# Patient Record
Sex: Male | Born: 1990 | Race: Black or African American | Hispanic: No | Marital: Single | Smoking: Current every day smoker
Health system: Southern US, Community
[De-identification: ages and names within clinical notes are randomized; demographics above are authoritative.]

## PROBLEM LIST (undated history)

## (undated) DIAGNOSIS — W3400XA Accidental discharge from unspecified firearms or gun, initial encounter: Secondary | ICD-10-CM

---
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
## 2008-04-11 ENCOUNTER — Observation Stay (HOSPITAL_COMMUNITY): Admission: AC | Admit: 2008-04-11 | Discharge: 2008-04-12 | Payer: Self-pay
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
## 2011-09-07 LAB — SAMPLE TO BLOOD BANK

## 2011-09-07 LAB — POCT I-STAT, CHEM 8
Creatinine, Ser: 1.1
HCT: 47
Hemoglobin: 16
Potassium: 2.8 — ABNORMAL LOW
Sodium: 141
TCO2: 24

## 2011-09-07 LAB — DIFFERENTIAL
Basophils Absolute: 0
Basophils Relative: 0
Lymphocytes Relative: 9 — ABNORMAL LOW
Monocytes Absolute: 0.7
Neutro Abs: 14.6 — ABNORMAL HIGH
Neutrophils Relative %: 87 — ABNORMAL HIGH

## 2011-09-07 LAB — CBC
HCT: 42.6
MCHC: 34.6
Platelets: 175
Platelets: 232
RDW: 14.3
RDW: 14.4
WBC: 11.8 — ABNORMAL HIGH

## 2011-09-07 LAB — PROTIME-INR
INR: 0.9
Prothrombin Time: 12.3

## 2011-09-17 LAB — DIFFERENTIAL
Basophils Absolute: 0
Eosinophils Relative: 2
Lymphocytes Relative: 43
Lymphs Abs: 5 — ABNORMAL HIGH
Monocytes Absolute: 0.6
Neutro Abs: 5.2

## 2011-09-17 LAB — TYPE AND SCREEN: ABO/RH(D): O POS

## 2011-09-17 LAB — CBC
HCT: 43
Hemoglobin: 14.7
RDW: 14.4
WBC: 14 — ABNORMAL HIGH

## 2011-09-17 LAB — I-STAT 8, (EC8 V) (CONVERTED LAB)
BUN: 10
Bicarbonate: 21.9
Glucose, Bld: 130 — ABNORMAL HIGH
TCO2: 23
pCO2, Ven: 39.1 — ABNORMAL LOW

## 2011-09-17 LAB — POCT I-STAT CREATININE: Operator id: 288831

## 2011-09-17 LAB — ABO/RH: ABO/RH(D): O POS

## 2014-12-04 ENCOUNTER — Emergency Department (HOSPITAL_COMMUNITY)
Admission: EM | Admit: 2014-12-04 | Discharge: 2014-12-04 | Disposition: A | Discharge status: Home / Self Care | Payer: Self-pay | Attending: Emergency Medicine | Admitting: Emergency Medicine

## 2014-12-04 ENCOUNTER — Encounter (HOSPITAL_COMMUNITY): Payer: Self-pay

## 2014-12-04 ENCOUNTER — Emergency Department (HOSPITAL_COMMUNITY): Payer: Self-pay

## 2014-12-04 DIAGNOSIS — W228XXA Striking against or struck by other objects, initial encounter: Secondary | ICD-10-CM | POA: Insufficient documentation

## 2014-12-04 DIAGNOSIS — Z23 Encounter for immunization: Secondary | ICD-10-CM | POA: Insufficient documentation

## 2014-12-04 DIAGNOSIS — Y9389 Activity, other specified: Secondary | ICD-10-CM | POA: Insufficient documentation

## 2014-12-04 DIAGNOSIS — Z720 Tobacco use: Secondary | ICD-10-CM | POA: Insufficient documentation

## 2014-12-04 DIAGNOSIS — Z87828 Personal history of other (healed) physical injury and trauma: Secondary | ICD-10-CM | POA: Insufficient documentation

## 2014-12-04 DIAGNOSIS — Y998 Other external cause status: Secondary | ICD-10-CM | POA: Insufficient documentation

## 2014-12-04 DIAGNOSIS — S61210A Laceration without foreign body of right index finger without damage to nail, initial encounter: Secondary | ICD-10-CM | POA: Insufficient documentation

## 2014-12-04 DIAGNOSIS — IMO0002 Reserved for concepts with insufficient information to code with codable children: Secondary | ICD-10-CM

## 2014-12-04 DIAGNOSIS — S41111A Laceration without foreign body of right upper arm, initial encounter: Secondary | ICD-10-CM | POA: Insufficient documentation

## 2014-12-04 DIAGNOSIS — Y9289 Other specified places as the place of occurrence of the external cause: Secondary | ICD-10-CM | POA: Insufficient documentation

## 2014-12-04 DIAGNOSIS — S51021A Laceration with foreign body of right elbow, initial encounter: Secondary | ICD-10-CM | POA: Insufficient documentation

## 2014-12-04 HISTORY — DX: Accidental discharge from unspecified firearms or gun, initial encounter: W34.00XA

## 2014-12-04 MED ORDER — TETANUS-DIPHTH-ACELL PERTUSSIS 5-2.5-18.5 LF-MCG/0.5 IM SUSP
0.5000 mL | Freq: Once | INTRAMUSCULAR | Status: AC
  Administered 2014-12-04: 0.5 mL via INTRAMUSCULAR
  Filled 2014-12-04: qty 0.5

## 2014-12-04 MED ORDER — OXYCODONE-ACETAMINOPHEN 5-325 MG PO TABS: 1.0000 | ORAL_TABLET | Freq: Once | ORAL | Status: DC

## 2014-12-04 MED ORDER — TRAMADOL HCL 50 MG PO TABS
50.0000 mg | ORAL_TABLET | Freq: Once | ORAL | Status: DC
Start: 2014-12-04 — End: 2014-12-04
  Filled 2014-12-04: qty 1

## 2014-12-04 MED ORDER — OXYCODONE-ACETAMINOPHEN 5-325 MG PO TABS: 1.0000 | ORAL_TABLET | Freq: Four times a day (QID) | ORAL | Status: DC | PRN

## 2014-12-04 MED ORDER — LIDOCAINE HCL (PF) 1 % IJ SOLN
5.0000 mL | Freq: Once | INTRAMUSCULAR | Status: AC
  Administered 2014-12-04: 5 mL
  Filled 2014-12-04: qty 5

## 2014-12-04 NOTE — ED Notes (Signed)
Pt called asking if a [employer] phone was turned in. 1 [employer] phone in lost and found, pt was unsure if this was his phone.
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
## 2014-12-04 NOTE — Discharge Instructions (Signed)
Your sutures should be removed in 10 days

## 2014-12-13 ENCOUNTER — Emergency Department (HOSPITAL_COMMUNITY)
Admission: EM | Admit: 2014-12-13 | Discharge: 2014-12-13 | Disposition: A | Discharge status: Home / Self Care | Payer: Self-pay | Attending: Emergency Medicine | Admitting: Emergency Medicine

## 2014-12-13 ENCOUNTER — Encounter (HOSPITAL_COMMUNITY): Payer: Self-pay | Admitting: Emergency Medicine

## 2014-12-13 DIAGNOSIS — Y848 Other medical procedures as the cause of abnormal reaction of the patient, or of later complication, without mention of misadventure at the time of the procedure: Secondary | ICD-10-CM | POA: Insufficient documentation

## 2014-12-13 DIAGNOSIS — T798XXA Other early complications of trauma, initial encounter: Secondary | ICD-10-CM

## 2014-12-13 DIAGNOSIS — T814XXA Infection following a procedure, initial encounter: Secondary | ICD-10-CM | POA: Insufficient documentation

## 2014-12-13 DIAGNOSIS — Z4802 Encounter for removal of sutures: Secondary | ICD-10-CM | POA: Insufficient documentation

## 2014-12-13 DIAGNOSIS — Z720 Tobacco use: Secondary | ICD-10-CM | POA: Insufficient documentation

## 2014-12-13 DIAGNOSIS — Z87828 Personal history of other (healed) physical injury and trauma: Secondary | ICD-10-CM | POA: Insufficient documentation

## 2014-12-13 MED ORDER — SULFAMETHOXAZOLE-TRIMETHOPRIM 800-160 MG PO TABS: 1.0000 | ORAL_TABLET | Freq: Two times a day (BID) | ORAL | Status: AC

## 2014-12-13 MED ORDER — STERILE WATER FOR INJECTION IJ SOLN
INTRAMUSCULAR | Status: AC
  Administered 2014-12-13: 10 mL
  Filled 2014-12-13: qty 10

## 2014-12-13 MED ORDER — OXYCODONE-ACETAMINOPHEN 5-325 MG PO TABS
2.0000 | ORAL_TABLET | Freq: Once | ORAL | Status: AC
  Administered 2014-12-13: 2 via ORAL
  Filled 2014-12-13: qty 2

## 2014-12-13 MED ORDER — OXYCODONE-ACETAMINOPHEN 5-325 MG PO TABS: 2.0000 | ORAL_TABLET | ORAL | Status: DC | PRN

## 2014-12-13 MED ORDER — HYDROCODONE-ACETAMINOPHEN 5-325 MG PO TABS: 2.0000 | ORAL_TABLET | ORAL | Status: DC | PRN

## 2014-12-13 MED ORDER — CEFTRIAXONE SODIUM 1 G IJ SOLR
1.0000 g | Freq: Once | INTRAMUSCULAR | Status: AC
  Administered 2014-12-13: 1 g via INTRAMUSCULAR
  Filled 2014-12-13: qty 10

## 2014-12-13 MED ORDER — HYDROCODONE-ACETAMINOPHEN 5-325 MG PO TABS
2.0000 | ORAL_TABLET | Freq: Once | ORAL | Status: DC
  Filled 2014-12-13: qty 2
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
## 2014-12-13 NOTE — ED Notes (Signed)
Pt states that he was here on 12/23 for lac to lt [facility].  States it was debrided and is here in extreme pain asking for sutures to be removed.  Wound is obviously infected with an open area and pus visualized.

## 2014-12-13 NOTE — Discharge Instructions (Signed)
Wound Infection °A wound infection happens when a type of germ (bacteria) starts growing in the wound. In some cases, this can cause the wound to break open. If cared for properly, the infected wound will heal from the inside to the outside. Wound infections need treatment. °CAUSES °An infection is caused by bacteria growing in the wound.  °SYMPTOMS  °· Increase in redness, swelling, or pain at the wound site. °· Increase in drainage at the wound site. °· Wound or bandage (dressing) starts to smell bad. °· Fever. °· Feeling tired or fatigued. °· Pus draining from the wound. °TREATMENT  °Your health care provider will prescribe antibiotic medicine. The wound infection should improve within 24 to 48 hours. Any redness around the wound should stop spreading and the wound should be less painful.  °HOME CARE INSTRUCTIONS  °· Only take over-the-counter or prescription medicines for pain, discomfort, or fever as directed by your health care provider. °· Take your antibiotics as directed. Finish them even if you start to feel better. °· Gently wash the area with mild soap and water 2 times a day, or as directed. Rinse off the soap. Pat the area dry with a clean towel. Do not rub the wound. This may cause bleeding. °· Follow your health care provider's instructions for how often you need to change the dressing. °· Apply ointment and a dressing to the wound as directed. °· If the dressing sticks, moisten it with soapy water and gently remove it. °· Change the bandage right away if it becomes wet, dirty, or develops a bad smell. °· Take showers. Do not take tub baths, swim, or do anything that may soak the wound until it is healed. °· Avoid exercises that make you sweat heavily. °· Use anti-itch medicine as directed by your health care provider. The wound may itch when it is healing. Do not pick or scratch at the wound. °· Follow up with your health care provider to get your wound rechecked as directed. °SEEK MEDICAL CARE  IF: °· You have an increase in swelling, pain, or redness around the wound. °· You have an increase in the amount of pus coming from the wound. °· There is a bad smell coming from the wound. °· More of the wound breaks open. °· You have a fever. °MAKE SURE YOU:  °· Understand these instructions. °· Will watch your condition. °· Will get help right away if you are not doing well or get worse. °Document Released: 08/28/2003 Document Revised: 12/04/2013 Document Reviewed: 04/04/2011 °ExitCare® Patient Information ©2015 [employer]. This information is not intended to replace advice given to you by your health care provider. Make sure you discuss any questions you have with your health care provider. ° °
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
## 2016-12-09 ENCOUNTER — Emergency Department (HOSPITAL_COMMUNITY)
Admission: EM | Admit: 2016-12-09 | Discharge: 2016-12-09 | Disposition: A | Discharge status: Home / Self Care | Payer: Self-pay | Attending: Emergency Medicine | Admitting: Emergency Medicine

## 2016-12-09 ENCOUNTER — Encounter (HOSPITAL_COMMUNITY): Payer: Self-pay

## 2016-12-09 DIAGNOSIS — Y638 Failure in dosage during other surgical and medical care: Secondary | ICD-10-CM | POA: Insufficient documentation

## 2016-12-09 DIAGNOSIS — T507X1A Poisoning by analeptics and opioid receptor antagonists, accidental (unintentional), initial encounter: Secondary | ICD-10-CM | POA: Insufficient documentation

## 2016-12-09 DIAGNOSIS — T40601A Poisoning by unspecified narcotics, accidental (unintentional), initial encounter: Secondary | ICD-10-CM

## 2016-12-09 DIAGNOSIS — F17200 Nicotine dependence, unspecified, uncomplicated: Secondary | ICD-10-CM | POA: Insufficient documentation

## 2016-12-09 NOTE — ED Notes (Signed)
Patient is alert and oriented x3.  He was given DC instructions and follow up visit instructions.  Patient gave verbal understanding.  He was DC ambulatory under his own power to home.  V/S stable.  He was not showing any signs of distress on DC 

## 2016-12-09 NOTE — ED Notes (Signed)
Pt provided ice water 

## 2016-12-09 NOTE — ED Triage Notes (Signed)
Pt comes to ed, via ems, was given 0.5 narcan given. Pt was drinking and sniffing opioids.  Pt is cold and alert.

## 2016-12-09 NOTE — ED Notes (Signed)
Bed: RESA Expected date:  Expected time:  Means of arrival:  Comments: [facility]

## 2016-12-09 NOTE — ED Notes (Signed)
Bed: WA16 Expected date:  Expected time:  Means of arrival:  Comments: RES A 

## 2016-12-09 NOTE — Discharge Instructions (Signed)
You were seen in the ED with a narcotic overdose. Follow up with your PCP when you are able. Return to the ED immediately with any chest pain, difficulty breathing, fever, or if you are difficult to arouse.

## 2016-12-09 NOTE — ED Triage Notes (Signed)
Nurse charted under the techs name, pt comes from ems, was given 0.5 narcan. Pt admits to breaking oxycodone pills and sniffing them throughout the day. Pt also admits to cocaine and drinking today.  Pt is alert and provider was at bedside

## 2016-12-09 NOTE — ED Provider Notes (Signed)
Blood pressure 126/70, pulse 93, temperature 98 F (36.7 C), temperature source Oral, resp. rate 16, height 6\' 4"  (1.93 m), weight 200 lb (90.7 kg), SpO2 95 %.  Assuming care from Dr. Criss AlvineGoldston.  In short, Thad RangerCharles Whiteman is a 25 y.o. male with a chief complaint of Drug Overdose (given narcan ) .  Refer to the original H&P for additional details.  The current plan of care is to monitor in the ED and reassess prior to discharge.  09:30 AM Patient is awake and alert. No additional narcan needed. No hypoxemia. Plan for discharge with police.   Alona BeneJoshua Long, MD    Maia PlanJoshua G Long, MD 12/09/16 [phone]
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
## 2017-02-27 ENCOUNTER — Emergency Department (HOSPITAL_COMMUNITY): Payer: Self-pay

## 2017-02-27 ENCOUNTER — Emergency Department (HOSPITAL_COMMUNITY)
Admission: EM | Admit: 2017-02-27 | Discharge: 2017-02-27 | Disposition: A | Discharge status: Home / Self Care | Payer: Self-pay | Attending: Emergency Medicine | Admitting: Emergency Medicine

## 2017-02-27 ENCOUNTER — Encounter (HOSPITAL_COMMUNITY): Payer: Self-pay | Admitting: Emergency Medicine

## 2017-02-27 DIAGNOSIS — Z23 Encounter for immunization: Secondary | ICD-10-CM | POA: Insufficient documentation

## 2017-02-27 DIAGNOSIS — Z203 Contact with and (suspected) exposure to rabies: Secondary | ICD-10-CM | POA: Insufficient documentation

## 2017-02-27 DIAGNOSIS — S81851A Open bite, right lower leg, initial encounter: Secondary | ICD-10-CM | POA: Insufficient documentation

## 2017-02-27 DIAGNOSIS — F17200 Nicotine dependence, unspecified, uncomplicated: Secondary | ICD-10-CM | POA: Insufficient documentation

## 2017-02-27 DIAGNOSIS — Y9301 Activity, walking, marching and hiking: Secondary | ICD-10-CM | POA: Insufficient documentation

## 2017-02-27 DIAGNOSIS — Z79899 Other long term (current) drug therapy: Secondary | ICD-10-CM | POA: Insufficient documentation

## 2017-02-27 DIAGNOSIS — Y929 Unspecified place or not applicable: Secondary | ICD-10-CM | POA: Insufficient documentation

## 2017-02-27 DIAGNOSIS — Y999 Unspecified external cause status: Secondary | ICD-10-CM | POA: Insufficient documentation

## 2017-02-27 DIAGNOSIS — W540XXA Bitten by dog, initial encounter: Secondary | ICD-10-CM | POA: Insufficient documentation

## 2017-02-27 MED ORDER — TETANUS-DIPHTH-ACELL PERTUSSIS 5-2.5-18.5 LF-MCG/0.5 IM SUSP
0.5000 mL | Freq: Once | INTRAMUSCULAR | Status: AC
  Administered 2017-02-27: 0.5 mL via INTRAMUSCULAR
  Filled 2017-02-27: qty 0.5

## 2017-02-27 MED ORDER — AMOXICILLIN-POT CLAVULANATE 875-125 MG PO TABS: 1.0000 | ORAL_TABLET | Freq: Two times a day (BID) | ORAL | 0 refills | Status: AC

## 2017-02-27 MED ORDER — LIDOCAINE-EPINEPHRINE 2 %-1:100000 IJ SOLN
40.0000 mL | Freq: Once | INTRAMUSCULAR | Status: AC
  Administered 2017-02-27: 40 mL via INTRADERMAL
  Filled 2017-02-27: qty 40

## 2017-02-27 MED ORDER — RABIES IMMUNE GLOBULIN 150 UNIT/ML IM INJ
20.0000 [IU]/kg | INJECTION | Freq: Once | INTRAMUSCULAR | Status: AC
  Administered 2017-02-27: 1725 [IU] via INTRAMUSCULAR
  Filled 2017-02-27: qty 11.5

## 2017-02-27 MED ORDER — AMOXICILLIN-POT CLAVULANATE 875-125 MG PO TABS
1.0000 | ORAL_TABLET | Freq: Once | ORAL | Status: AC
  Administered 2017-02-27: 1 via ORAL
  Filled 2017-02-27: qty 1

## 2017-02-27 MED ORDER — SODIUM CHLORIDE 0.9 % IV BOLUS (SEPSIS)
1000.0000 mL | Freq: Once | INTRAVENOUS | Status: AC
  Administered 2017-02-27: 1000 mL via INTRAVENOUS

## 2017-02-27 MED ORDER — HYDROMORPHONE HCL 1 MG/ML IJ SOLN
1.0000 mg | Freq: Once | INTRAMUSCULAR | Status: AC
  Administered 2017-02-27: 1 mg via INTRAVENOUS
  Filled 2017-02-27: qty 1

## 2017-02-27 MED ORDER — DIPHENHYDRAMINE HCL 50 MG/ML IJ SOLN
25.0000 mg | Freq: Once | INTRAMUSCULAR | Status: AC
  Administered 2017-02-27: 25 mg via INTRAVENOUS
  Filled 2017-02-27: qty 1

## 2017-02-27 MED ORDER — RABIES VACCINE, PCEC IM SUSR
1.0000 mL | Freq: Once | INTRAMUSCULAR | Status: AC
  Administered 2017-02-27: 1 mL via INTRAMUSCULAR
  Filled 2017-02-27: qty 1

## 2017-02-27 MED ORDER — OXYCODONE-ACETAMINOPHEN 5-325 MG PO TABS: 1.0000 | ORAL_TABLET | Freq: Three times a day (TID) | ORAL | 0 refills | Status: AC | PRN

## 2017-02-27 MED ORDER — OXYCODONE-ACETAMINOPHEN 5-325 MG PO TABS
1.0000 | ORAL_TABLET | Freq: Once | ORAL | Status: AC
  Administered 2017-02-27: 1 via ORAL
  Filled 2017-02-27: qty 1
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
## 2017-02-27 NOTE — ED Notes (Signed)
Pt and family member were asking for chux pad d/t bleeding from his wounds.  Pt is walking around in the room eating with blood dripping from his R leg.  When this nurse went to get chux for pt to take home, a pack of green chux noted in a pt belonging bag.  Asked them if they still need more than what they already have in their bag, she states "yes because he's bleeding a lot."  Wound dsg placed on pt's R leg.  Instructed pt not take the dsg off until wound stops bleeding.  While discharging pt, there was a tub of clorox wipes in another belonging bag, made pt aware that it is not for skin,  Pt states "oh, I thought it's for your skin,"  Pt made aware that those wipes are not for skin and that he is not supposed to be taking them because it's the ED's.

## 2017-02-27 NOTE — Discharge Instructions (Signed)
You have been evaluated for your dog bite.  Keep your wound clean, wash it daily with gentle soaps and water and apply dressing to protect it.  Take antibiotic and pain medication as prescribed.  Since you have the risk of contracting rabies, please follow up at the Urgent Care center for rabies series.  You should follow up 3 day, 7 days, and 14 days from now (3/21, 3/24, 3/31) Return to the ER if you have any concerns

## 2017-02-27 NOTE — ED Triage Notes (Signed)
Pt with dog bite to R lower leg. Bite to medial R knee and R medial calf. Pt states the dog is unknown to him and approached him while he was walking and attacked. Pt calm, alert and oriented.

## 2017-02-27 NOTE — ED Notes (Signed)
Will update VS when PA is finished with sutures

## 2017-11-16 ENCOUNTER — Emergency Department (HOSPITAL_COMMUNITY)
Admission: EM | Admit: 2017-11-16 | Discharge: 2017-11-16 | Disposition: A | Discharge status: Home / Self Care | Payer: Self-pay | Attending: Emergency Medicine | Admitting: Emergency Medicine

## 2017-11-16 ENCOUNTER — Other Ambulatory Visit: Payer: Self-pay

## 2017-11-16 ENCOUNTER — Encounter (HOSPITAL_COMMUNITY): Payer: Self-pay | Admitting: Emergency Medicine

## 2017-11-16 DIAGNOSIS — Z4802 Encounter for removal of sutures: Secondary | ICD-10-CM | POA: Insufficient documentation

## 2017-11-16 DIAGNOSIS — Z5321 Procedure and treatment not carried out due to patient leaving prior to being seen by health care provider: Secondary | ICD-10-CM | POA: Insufficient documentation

## 2017-11-16 NOTE — ED Notes (Signed)
Called  No response from lobby 

## 2017-11-16 NOTE — ED Triage Notes (Signed)
Pt states he was bit by a stray dog on March 18th and came in and got stitches  Pt states he came back and had the stitches removed  Pt states tonight he wants his wounds checked because he thinks he has a stitch coming out of one of the areas

## 2018-05-24 IMAGING — DX DG KNEE COMPLETE 4+V*R*
4 series · 4 of 4 positions shown · non-contrast
Comparison: None.

CLINICAL DATA: Pain medial popliteal region and lateral mid lower
leg with lacerations post dog bite this morning.

EXAM:
RIGHT KNEE - COMPLETE 4+ VIEW

[knee ap]
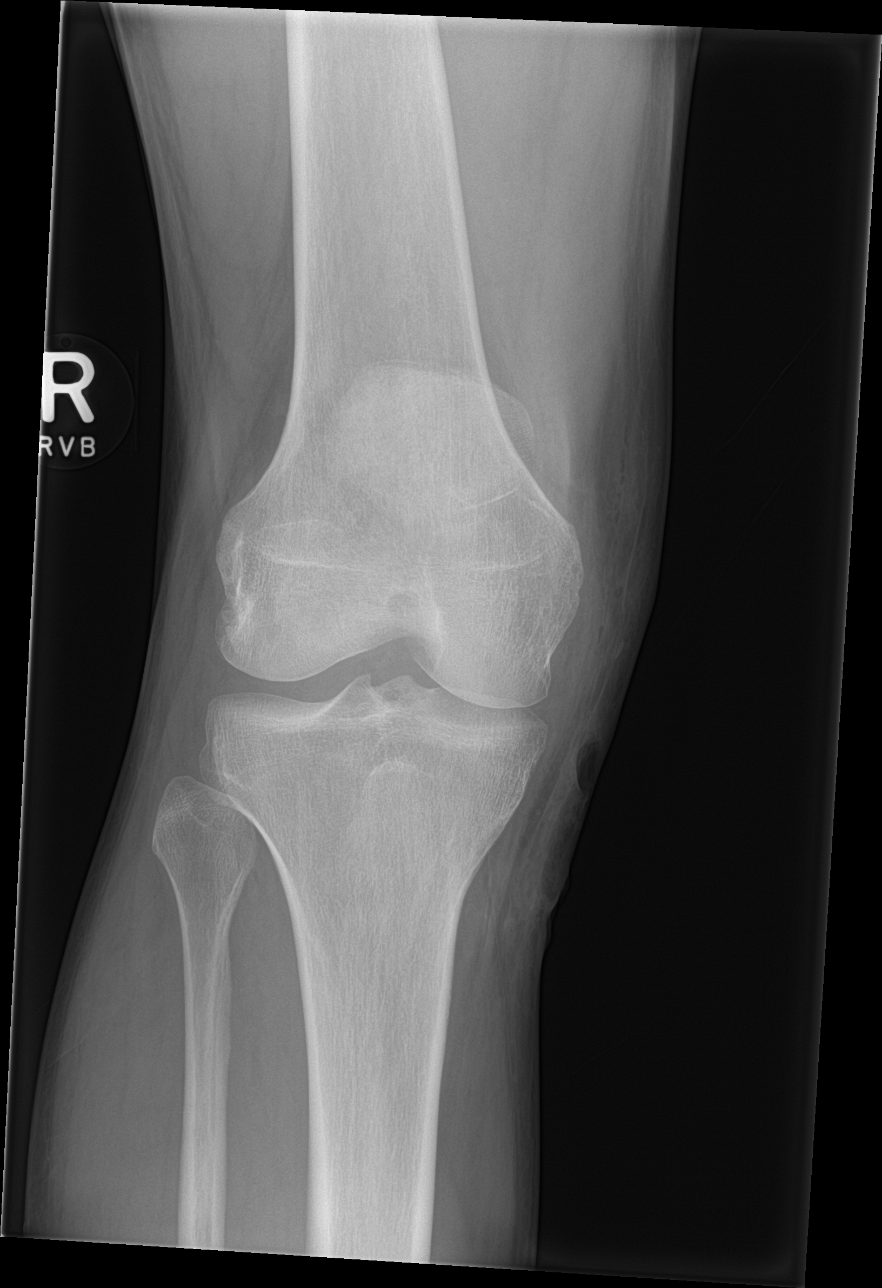

[knee tunnel]
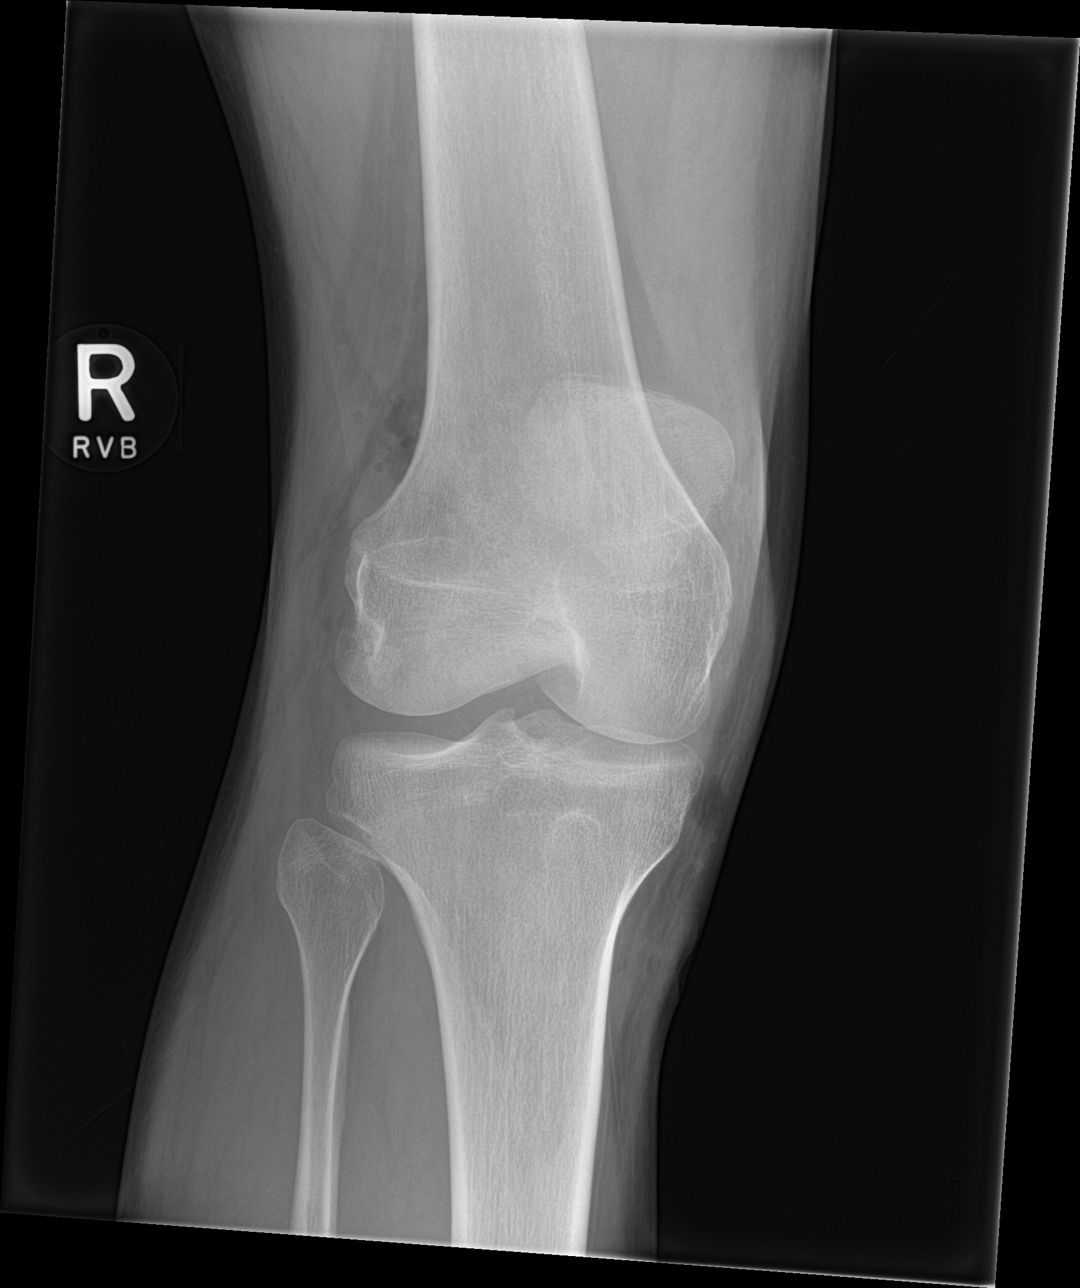

[knee lat]
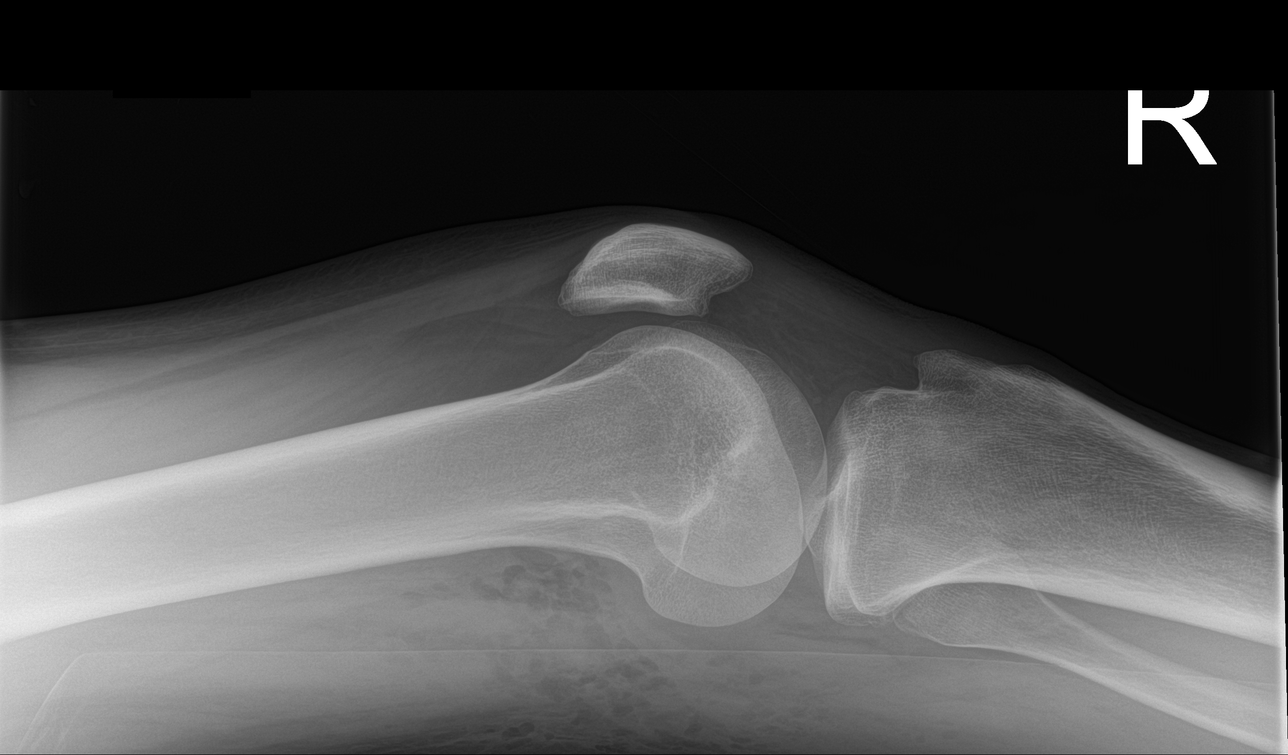

[knee obl]
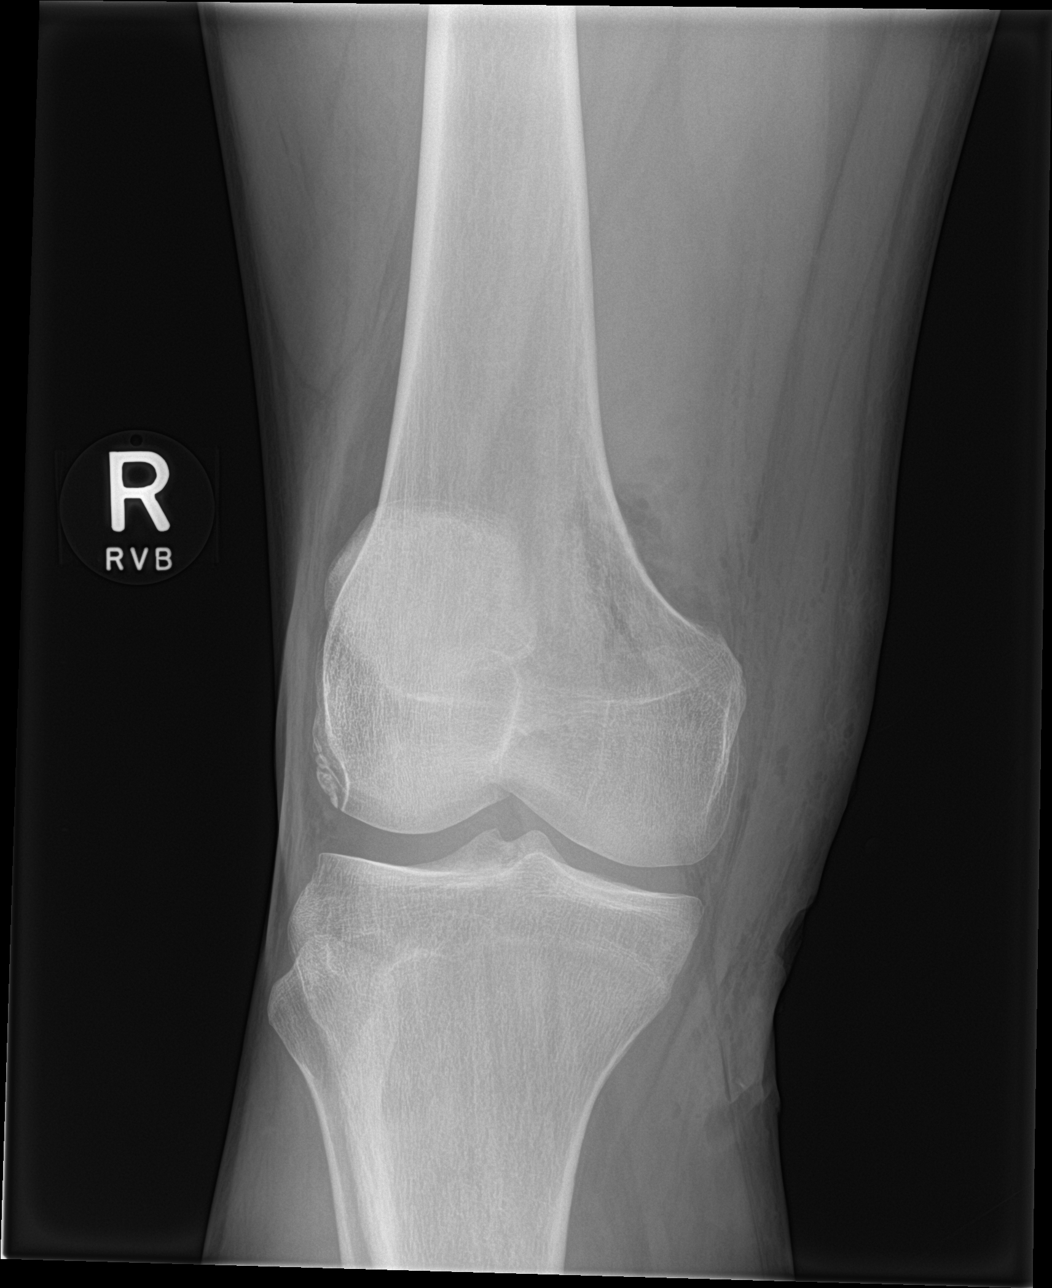

[4 of 4 positions shown; findings below may reference images not displayed]

FINDINGS: Bony structures and joint spaces are normal. There is mild air over
the popliteal and medial soft tissues compatible with dog bite
injury.
IMPRESSION: No acute fracture or foreign body.

Minimal air in the popliteal and medial soft tissues compatible with
dog bite injury.
